# Patient Record
Sex: Male | Born: 1995 | Race: White | Hispanic: No | Marital: Married
Health system: Southern US, Community
[De-identification: ages and names within clinical notes are randomized; demographics above are authoritative.]

---
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
## 2014-08-30 ENCOUNTER — Ambulatory Visit (INDEPENDENT_AMBULATORY_CARE_PROVIDER_SITE_OTHER): Payer: BLUE CROSS/BLUE SHIELD | Admitting: Internal Medicine

## 2014-08-30 DIAGNOSIS — Z23 Encounter for immunization: Secondary | ICD-10-CM

## 2014-08-30 DIAGNOSIS — Z9189 Other specified personal risk factors, not elsewhere classified: Secondary | ICD-10-CM | POA: Diagnosis not present

## 2014-08-30 DIAGNOSIS — Z7184 Encounter for health counseling related to travel: Secondary | ICD-10-CM

## 2014-08-30 DIAGNOSIS — Z7189 Other specified counseling: Secondary | ICD-10-CM | POA: Diagnosis not present

## 2014-08-30 MED ORDER — CIPROFLOXACIN HCL 500 MG PO TABS: 500.0000 mg | ORAL_TABLET | Freq: Two times a day (BID) | ORAL | Status: AC

## 2014-08-30 NOTE — Progress Notes (Signed)
   Subjective:    Patient ID: Jeremy Gross, male    DOB: [birth year], 19 y.o.   MRN: [id number]  HPI Vernona Rieger19yo M in good state of health, college student at [employer] who has travel grant to go to [place], he will be leaving June 8th thru 20th visiting [place], [place], [place]. He will be staying with friends  Previous travel to [place]. Flu vaccine this year and uptodate on childhood vaccines including influenze   Review of Systems     Objective:   Physical Exam        Assessment & Plan:  Reviewed itinerary, it does not look like he is going to any endemic areas of malaria nor yellow fever  = gave typhoid vaccine = gave rx for cipro to use if needed = discussed how to acclimate for altitude sickness, and can use scheduled ibuprofen if needed

## 2015-07-11 DIAGNOSIS — N63 Unspecified lump in breast: Secondary | ICD-10-CM | POA: Diagnosis not present

## 2015-09-26 DIAGNOSIS — H1045 Other chronic allergic conjunctivitis: Secondary | ICD-10-CM | POA: Diagnosis not present

## 2015-10-31 DIAGNOSIS — L03039 Cellulitis of unspecified toe: Secondary | ICD-10-CM | POA: Diagnosis not present

## 2015-11-11 DIAGNOSIS — D2261 Melanocytic nevi of right upper limb, including shoulder: Secondary | ICD-10-CM | POA: Diagnosis not present

## 2015-11-11 DIAGNOSIS — L700 Acne vulgaris: Secondary | ICD-10-CM | POA: Diagnosis not present

## 2015-11-11 DIAGNOSIS — B078 Other viral warts: Secondary | ICD-10-CM | POA: Diagnosis not present

## 2015-11-11 DIAGNOSIS — D2271 Melanocytic nevi of right lower limb, including hip: Secondary | ICD-10-CM | POA: Diagnosis not present

## 2015-12-13 DIAGNOSIS — M546 Pain in thoracic spine: Secondary | ICD-10-CM | POA: Diagnosis not present

## 2015-12-13 DIAGNOSIS — X509XXA Other and unspecified overexertion or strenuous movements or postures, initial encounter: Secondary | ICD-10-CM | POA: Diagnosis not present

## 2016-01-03 DIAGNOSIS — Z23 Encounter for immunization: Secondary | ICD-10-CM | POA: Diagnosis not present

## 2016-02-24 DIAGNOSIS — F419 Anxiety disorder, unspecified: Secondary | ICD-10-CM | POA: Diagnosis not present

## 2016-02-24 DIAGNOSIS — F329 Major depressive disorder, single episode, unspecified: Secondary | ICD-10-CM | POA: Diagnosis not present

## 2016-05-01 DIAGNOSIS — L0232 Furuncle of buttock: Secondary | ICD-10-CM | POA: Diagnosis not present

## 2016-05-01 DIAGNOSIS — K5900 Constipation, unspecified: Secondary | ICD-10-CM | POA: Diagnosis not present

## 2016-05-05 DIAGNOSIS — R42 Dizziness and giddiness: Secondary | ICD-10-CM | POA: Diagnosis not present

## 2016-07-12 DIAGNOSIS — L0292 Furuncle, unspecified: Secondary | ICD-10-CM | POA: Diagnosis not present

## 2016-08-31 DIAGNOSIS — R1032 Left lower quadrant pain: Secondary | ICD-10-CM | POA: Diagnosis not present

## 2016-08-31 DIAGNOSIS — Z79899 Other long term (current) drug therapy: Secondary | ICD-10-CM | POA: Diagnosis not present

## 2016-08-31 DIAGNOSIS — K219 Gastro-esophageal reflux disease without esophagitis: Secondary | ICD-10-CM | POA: Diagnosis not present

## 2016-09-07 DIAGNOSIS — R05 Cough: Secondary | ICD-10-CM | POA: Diagnosis not present

## 2016-09-07 DIAGNOSIS — J3089 Other allergic rhinitis: Secondary | ICD-10-CM | POA: Diagnosis not present

## 2016-09-07 DIAGNOSIS — J301 Allergic rhinitis due to pollen: Secondary | ICD-10-CM | POA: Diagnosis not present

## 2016-09-07 DIAGNOSIS — J3081 Allergic rhinitis due to animal (cat) (dog) hair and dander: Secondary | ICD-10-CM | POA: Diagnosis not present

## 2016-12-23 DIAGNOSIS — F329 Major depressive disorder, single episode, unspecified: Secondary | ICD-10-CM | POA: Diagnosis not present

## 2017-01-25 DIAGNOSIS — Z23 Encounter for immunization: Secondary | ICD-10-CM | POA: Diagnosis not present

## 2017-01-27 DIAGNOSIS — F419 Anxiety disorder, unspecified: Secondary | ICD-10-CM | POA: Diagnosis not present

## 2017-01-28 DIAGNOSIS — H1045 Other chronic allergic conjunctivitis: Secondary | ICD-10-CM | POA: Diagnosis not present

## 2017-03-04 DIAGNOSIS — K219 Gastro-esophageal reflux disease without esophagitis: Secondary | ICD-10-CM | POA: Diagnosis not present

## 2017-03-04 DIAGNOSIS — R143 Flatulence: Secondary | ICD-10-CM | POA: Diagnosis not present

## 2017-03-21 DIAGNOSIS — D2239 Melanocytic nevi of other parts of face: Secondary | ICD-10-CM | POA: Diagnosis not present

## 2017-03-21 DIAGNOSIS — D2271 Melanocytic nevi of right lower limb, including hip: Secondary | ICD-10-CM | POA: Diagnosis not present

## 2017-03-21 DIAGNOSIS — L738 Other specified follicular disorders: Secondary | ICD-10-CM | POA: Diagnosis not present

## 2017-03-21 DIAGNOSIS — B078 Other viral warts: Secondary | ICD-10-CM | POA: Diagnosis not present

## 2017-03-21 DIAGNOSIS — D225 Melanocytic nevi of trunk: Secondary | ICD-10-CM | POA: Diagnosis not present

## 2017-04-08 DIAGNOSIS — F411 Generalized anxiety disorder: Secondary | ICD-10-CM | POA: Diagnosis not present

## 2017-04-08 DIAGNOSIS — F909 Attention-deficit hyperactivity disorder, unspecified type: Secondary | ICD-10-CM | POA: Diagnosis not present

## 2017-04-11 DIAGNOSIS — F909 Attention-deficit hyperactivity disorder, unspecified type: Secondary | ICD-10-CM | POA: Diagnosis not present

## 2017-04-11 DIAGNOSIS — F411 Generalized anxiety disorder: Secondary | ICD-10-CM | POA: Diagnosis not present

## 2017-04-20 DIAGNOSIS — F411 Generalized anxiety disorder: Secondary | ICD-10-CM | POA: Diagnosis not present

## 2017-04-20 DIAGNOSIS — F909 Attention-deficit hyperactivity disorder, unspecified type: Secondary | ICD-10-CM | POA: Diagnosis not present

## 2017-04-22 DIAGNOSIS — F900 Attention-deficit hyperactivity disorder, predominantly inattentive type: Secondary | ICD-10-CM | POA: Diagnosis not present

## 2017-04-22 DIAGNOSIS — F419 Anxiety disorder, unspecified: Secondary | ICD-10-CM | POA: Diagnosis not present

## 2017-04-26 DIAGNOSIS — F419 Anxiety disorder, unspecified: Secondary | ICD-10-CM | POA: Diagnosis not present

## 2017-04-29 DIAGNOSIS — F411 Generalized anxiety disorder: Secondary | ICD-10-CM | POA: Diagnosis not present

## 2017-04-29 DIAGNOSIS — F909 Attention-deficit hyperactivity disorder, unspecified type: Secondary | ICD-10-CM | POA: Diagnosis not present

## 2017-05-03 DIAGNOSIS — F909 Attention-deficit hyperactivity disorder, unspecified type: Secondary | ICD-10-CM | POA: Diagnosis not present

## 2017-05-03 DIAGNOSIS — F419 Anxiety disorder, unspecified: Secondary | ICD-10-CM | POA: Diagnosis not present

## 2017-05-11 DIAGNOSIS — F909 Attention-deficit hyperactivity disorder, unspecified type: Secondary | ICD-10-CM | POA: Diagnosis not present

## 2017-05-11 DIAGNOSIS — F419 Anxiety disorder, unspecified: Secondary | ICD-10-CM | POA: Diagnosis not present

## 2017-05-16 DIAGNOSIS — R1013 Epigastric pain: Secondary | ICD-10-CM | POA: Diagnosis not present

## 2017-05-16 DIAGNOSIS — R109 Unspecified abdominal pain: Secondary | ICD-10-CM | POA: Diagnosis not present

## 2017-05-23 DIAGNOSIS — F419 Anxiety disorder, unspecified: Secondary | ICD-10-CM | POA: Diagnosis not present

## 2017-05-23 DIAGNOSIS — F900 Attention-deficit hyperactivity disorder, predominantly inattentive type: Secondary | ICD-10-CM | POA: Diagnosis not present

## 2017-05-30 DIAGNOSIS — F900 Attention-deficit hyperactivity disorder, predominantly inattentive type: Secondary | ICD-10-CM | POA: Diagnosis not present

## 2017-05-30 DIAGNOSIS — R109 Unspecified abdominal pain: Secondary | ICD-10-CM | POA: Diagnosis not present

## 2017-05-30 DIAGNOSIS — F419 Anxiety disorder, unspecified: Secondary | ICD-10-CM | POA: Diagnosis not present

## 2017-06-15 DIAGNOSIS — F900 Attention-deficit hyperactivity disorder, predominantly inattentive type: Secondary | ICD-10-CM | POA: Diagnosis not present

## 2017-06-15 DIAGNOSIS — F419 Anxiety disorder, unspecified: Secondary | ICD-10-CM | POA: Diagnosis not present

## 2017-06-16 DIAGNOSIS — F419 Anxiety disorder, unspecified: Secondary | ICD-10-CM | POA: Diagnosis not present

## 2017-06-16 DIAGNOSIS — F900 Attention-deficit hyperactivity disorder, predominantly inattentive type: Secondary | ICD-10-CM | POA: Diagnosis not present

## 2017-06-23 DIAGNOSIS — F900 Attention-deficit hyperactivity disorder, predominantly inattentive type: Secondary | ICD-10-CM | POA: Diagnosis not present

## 2017-06-23 DIAGNOSIS — F419 Anxiety disorder, unspecified: Secondary | ICD-10-CM | POA: Diagnosis not present

## 2017-07-12 DIAGNOSIS — F419 Anxiety disorder, unspecified: Secondary | ICD-10-CM | POA: Diagnosis not present

## 2017-07-12 DIAGNOSIS — F900 Attention-deficit hyperactivity disorder, predominantly inattentive type: Secondary | ICD-10-CM | POA: Diagnosis not present

## 2017-07-27 DIAGNOSIS — F900 Attention-deficit hyperactivity disorder, predominantly inattentive type: Secondary | ICD-10-CM | POA: Diagnosis not present

## 2017-07-27 DIAGNOSIS — F419 Anxiety disorder, unspecified: Secondary | ICD-10-CM | POA: Diagnosis not present

## 2017-08-02 DIAGNOSIS — F909 Attention-deficit hyperactivity disorder, unspecified type: Secondary | ICD-10-CM | POA: Diagnosis not present

## 2017-08-02 DIAGNOSIS — F411 Generalized anxiety disorder: Secondary | ICD-10-CM | POA: Diagnosis not present

## 2017-10-17 DIAGNOSIS — F909 Attention-deficit hyperactivity disorder, unspecified type: Secondary | ICD-10-CM | POA: Diagnosis not present

## 2017-10-17 DIAGNOSIS — F419 Anxiety disorder, unspecified: Secondary | ICD-10-CM | POA: Diagnosis not present

## 2017-10-19 DIAGNOSIS — F419 Anxiety disorder, unspecified: Secondary | ICD-10-CM | POA: Diagnosis not present

## 2017-10-19 DIAGNOSIS — F900 Attention-deficit hyperactivity disorder, predominantly inattentive type: Secondary | ICD-10-CM | POA: Diagnosis not present

## 2017-12-22 DIAGNOSIS — Z23 Encounter for immunization: Secondary | ICD-10-CM | POA: Diagnosis not present

## 2018-02-03 DIAGNOSIS — F419 Anxiety disorder, unspecified: Secondary | ICD-10-CM | POA: Diagnosis not present

## 2018-02-03 DIAGNOSIS — F900 Attention-deficit hyperactivity disorder, predominantly inattentive type: Secondary | ICD-10-CM | POA: Diagnosis not present

## 2018-04-25 DIAGNOSIS — Z79899 Other long term (current) drug therapy: Secondary | ICD-10-CM | POA: Diagnosis not present

## 2018-04-25 DIAGNOSIS — F900 Attention-deficit hyperactivity disorder, predominantly inattentive type: Secondary | ICD-10-CM | POA: Diagnosis not present

## 2018-04-25 DIAGNOSIS — F411 Generalized anxiety disorder: Secondary | ICD-10-CM | POA: Diagnosis not present

## 2018-04-28 DIAGNOSIS — D225 Melanocytic nevi of trunk: Secondary | ICD-10-CM | POA: Diagnosis not present

## 2018-04-28 DIAGNOSIS — L812 Freckles: Secondary | ICD-10-CM | POA: Diagnosis not present

## 2018-04-28 DIAGNOSIS — D2222 Melanocytic nevi of left ear and external auricular canal: Secondary | ICD-10-CM | POA: Diagnosis not present

## 2018-04-28 DIAGNOSIS — L853 Xerosis cutis: Secondary | ICD-10-CM | POA: Diagnosis not present

## 2018-05-09 DIAGNOSIS — F411 Generalized anxiety disorder: Secondary | ICD-10-CM | POA: Diagnosis not present

## 2018-05-23 DIAGNOSIS — F411 Generalized anxiety disorder: Secondary | ICD-10-CM | POA: Diagnosis not present

## 2018-06-05 DIAGNOSIS — F411 Generalized anxiety disorder: Secondary | ICD-10-CM | POA: Diagnosis not present

## 2018-06-19 DIAGNOSIS — F411 Generalized anxiety disorder: Secondary | ICD-10-CM | POA: Diagnosis not present

## 2018-07-24 DIAGNOSIS — F411 Generalized anxiety disorder: Secondary | ICD-10-CM | POA: Diagnosis not present

## 2018-07-24 DIAGNOSIS — F4321 Adjustment disorder with depressed mood: Secondary | ICD-10-CM | POA: Diagnosis not present

## 2018-07-26 DIAGNOSIS — F900 Attention-deficit hyperactivity disorder, predominantly inattentive type: Secondary | ICD-10-CM | POA: Diagnosis not present

## 2018-07-26 DIAGNOSIS — F419 Anxiety disorder, unspecified: Secondary | ICD-10-CM | POA: Diagnosis not present

## 2018-08-07 DIAGNOSIS — F411 Generalized anxiety disorder: Secondary | ICD-10-CM | POA: Diagnosis not present

## 2018-08-29 DIAGNOSIS — M542 Cervicalgia: Secondary | ICD-10-CM | POA: Diagnosis not present

## 2018-09-27 DIAGNOSIS — F900 Attention-deficit hyperactivity disorder, predominantly inattentive type: Secondary | ICD-10-CM | POA: Diagnosis not present

## 2018-09-27 DIAGNOSIS — F411 Generalized anxiety disorder: Secondary | ICD-10-CM | POA: Diagnosis not present

## 2018-09-27 DIAGNOSIS — F4321 Adjustment disorder with depressed mood: Secondary | ICD-10-CM | POA: Diagnosis not present

## 2018-10-04 DIAGNOSIS — M791 Myalgia, unspecified site: Secondary | ICD-10-CM | POA: Diagnosis not present

## 2018-10-04 DIAGNOSIS — R109 Unspecified abdominal pain: Secondary | ICD-10-CM | POA: Diagnosis not present

## 2018-10-04 DIAGNOSIS — R0989 Other specified symptoms and signs involving the circulatory and respiratory systems: Secondary | ICD-10-CM | POA: Diagnosis not present

## 2018-10-04 DIAGNOSIS — R0602 Shortness of breath: Secondary | ICD-10-CM | POA: Diagnosis not present

## 2018-10-04 DIAGNOSIS — R5383 Other fatigue: Secondary | ICD-10-CM | POA: Diagnosis not present

## 2018-10-04 DIAGNOSIS — R197 Diarrhea, unspecified: Secondary | ICD-10-CM | POA: Diagnosis not present

## 2018-10-04 DIAGNOSIS — R112 Nausea with vomiting, unspecified: Secondary | ICD-10-CM | POA: Diagnosis not present

## 2018-10-04 DIAGNOSIS — Z20828 Contact with and (suspected) exposure to other viral communicable diseases: Secondary | ICD-10-CM | POA: Diagnosis not present

## 2018-10-04 DIAGNOSIS — R05 Cough: Secondary | ICD-10-CM | POA: Diagnosis not present

## 2018-10-10 DIAGNOSIS — F4321 Adjustment disorder with depressed mood: Secondary | ICD-10-CM | POA: Diagnosis not present

## 2018-10-10 DIAGNOSIS — L03213 Periorbital cellulitis: Secondary | ICD-10-CM | POA: Diagnosis not present

## 2018-10-10 DIAGNOSIS — F411 Generalized anxiety disorder: Secondary | ICD-10-CM | POA: Diagnosis not present

## 2018-10-16 DIAGNOSIS — F4321 Adjustment disorder with depressed mood: Secondary | ICD-10-CM | POA: Diagnosis not present

## 2018-10-16 DIAGNOSIS — F411 Generalized anxiety disorder: Secondary | ICD-10-CM | POA: Diagnosis not present

## 2018-10-24 DIAGNOSIS — F411 Generalized anxiety disorder: Secondary | ICD-10-CM | POA: Diagnosis not present

## 2018-10-24 DIAGNOSIS — F4321 Adjustment disorder with depressed mood: Secondary | ICD-10-CM | POA: Diagnosis not present

## 2018-11-07 DIAGNOSIS — F4321 Adjustment disorder with depressed mood: Secondary | ICD-10-CM | POA: Diagnosis not present

## 2018-11-07 DIAGNOSIS — F411 Generalized anxiety disorder: Secondary | ICD-10-CM | POA: Diagnosis not present

## 2018-11-07 DIAGNOSIS — F909 Attention-deficit hyperactivity disorder, unspecified type: Secondary | ICD-10-CM | POA: Diagnosis not present

## 2018-11-21 DIAGNOSIS — F411 Generalized anxiety disorder: Secondary | ICD-10-CM | POA: Diagnosis not present

## 2018-12-21 DIAGNOSIS — F4321 Adjustment disorder with depressed mood: Secondary | ICD-10-CM | POA: Diagnosis not present

## 2018-12-21 DIAGNOSIS — F411 Generalized anxiety disorder: Secondary | ICD-10-CM | POA: Diagnosis not present

## 2018-12-21 DIAGNOSIS — F900 Attention-deficit hyperactivity disorder, predominantly inattentive type: Secondary | ICD-10-CM | POA: Diagnosis not present

## 2018-12-28 DIAGNOSIS — Z23 Encounter for immunization: Secondary | ICD-10-CM | POA: Diagnosis not present

## 2019-01-18 DIAGNOSIS — F411 Generalized anxiety disorder: Secondary | ICD-10-CM | POA: Diagnosis not present

## 2019-01-31 DIAGNOSIS — F411 Generalized anxiety disorder: Secondary | ICD-10-CM | POA: Diagnosis not present

## 2019-01-31 DIAGNOSIS — F900 Attention-deficit hyperactivity disorder, predominantly inattentive type: Secondary | ICD-10-CM | POA: Diagnosis not present

## 2019-02-22 DIAGNOSIS — F900 Attention-deficit hyperactivity disorder, predominantly inattentive type: Secondary | ICD-10-CM | POA: Diagnosis not present

## 2019-02-22 DIAGNOSIS — F411 Generalized anxiety disorder: Secondary | ICD-10-CM | POA: Diagnosis not present

## 2019-02-22 DIAGNOSIS — F419 Anxiety disorder, unspecified: Secondary | ICD-10-CM | POA: Diagnosis not present

## 2019-02-22 DIAGNOSIS — F4321 Adjustment disorder with depressed mood: Secondary | ICD-10-CM | POA: Diagnosis not present

## 2019-02-28 DIAGNOSIS — N50811 Right testicular pain: Secondary | ICD-10-CM | POA: Diagnosis not present

## 2019-04-25 DIAGNOSIS — K219 Gastro-esophageal reflux disease without esophagitis: Secondary | ICD-10-CM | POA: Diagnosis not present

## 2019-07-02 ENCOUNTER — Ambulatory Visit: Payer: Self-pay | Attending: Internal Medicine

## 2019-07-02 DIAGNOSIS — Z23 Encounter for immunization: Secondary | ICD-10-CM

## 2019-07-02 NOTE — Progress Notes (Signed)
   Covid-19 Vaccination Clinic  Name:  BARRE AYDELOTT    MRN: [id number] DOB: [birth year]  07/02/2019  Mr. Heo was observed post Covid-19 immunization for 15 minutes without incident. He was provided with Vaccine Information Sheet and instruction to access the V-Safe system.   Mr. Hirano was instructed to call 911 with any severe reactions post vaccine: Marland Kitchen Difficulty breathing  . Swelling of face and throat  . A fast heartbeat  . A bad rash all over body  . Dizziness and weakness   Immunizations Administered    Name Date Dose VIS Date Route   Pfizer COVID-19 Vaccine 07/02/2019  2:15 PM 0.3 mL 03/16/2019 Intramuscular   Manufacturer: [employer], [employer]   Lot: [id number]   NDC: [address]-6

## 2019-08-16 DIAGNOSIS — F4321 Adjustment disorder with depressed mood: Secondary | ICD-10-CM | POA: Diagnosis not present

## 2019-08-16 DIAGNOSIS — F900 Attention-deficit hyperactivity disorder, predominantly inattentive type: Secondary | ICD-10-CM | POA: Diagnosis not present

## 2019-08-16 DIAGNOSIS — F411 Generalized anxiety disorder: Secondary | ICD-10-CM | POA: Diagnosis not present

## 2019-08-28 DIAGNOSIS — J0110 Acute frontal sinusitis, unspecified: Secondary | ICD-10-CM | POA: Diagnosis not present

## 2019-08-28 DIAGNOSIS — J309 Allergic rhinitis, unspecified: Secondary | ICD-10-CM | POA: Diagnosis not present

## 2019-08-28 DIAGNOSIS — H6982 Other specified disorders of Eustachian tube, left ear: Secondary | ICD-10-CM | POA: Diagnosis not present

## 2019-09-28 DIAGNOSIS — F900 Attention-deficit hyperactivity disorder, predominantly inattentive type: Secondary | ICD-10-CM | POA: Diagnosis not present

## 2019-09-28 DIAGNOSIS — F411 Generalized anxiety disorder: Secondary | ICD-10-CM | POA: Diagnosis not present

## 2019-09-28 DIAGNOSIS — F4321 Adjustment disorder with depressed mood: Secondary | ICD-10-CM | POA: Diagnosis not present

## 2019-11-06 DIAGNOSIS — Z1331 Encounter for screening for depression: Secondary | ICD-10-CM | POA: Diagnosis not present

## 2019-11-06 DIAGNOSIS — Z20822 Contact with and (suspected) exposure to covid-19: Secondary | ICD-10-CM | POA: Diagnosis not present

## 2019-11-26 DIAGNOSIS — Z20822 Contact with and (suspected) exposure to covid-19: Secondary | ICD-10-CM | POA: Diagnosis not present

## 2019-11-26 DIAGNOSIS — Z20828 Contact with and (suspected) exposure to other viral communicable diseases: Secondary | ICD-10-CM | POA: Diagnosis not present

## 2019-11-26 DIAGNOSIS — R0981 Nasal congestion: Secondary | ICD-10-CM | POA: Diagnosis not present

## 2019-12-04 DIAGNOSIS — G5762 Lesion of plantar nerve, left lower limb: Secondary | ICD-10-CM | POA: Diagnosis not present

## 2019-12-19 DIAGNOSIS — F900 Attention-deficit hyperactivity disorder, predominantly inattentive type: Secondary | ICD-10-CM | POA: Diagnosis not present

## 2019-12-19 DIAGNOSIS — F411 Generalized anxiety disorder: Secondary | ICD-10-CM | POA: Diagnosis not present

## 2019-12-19 DIAGNOSIS — F4321 Adjustment disorder with depressed mood: Secondary | ICD-10-CM | POA: Diagnosis not present

## 2019-12-27 DIAGNOSIS — F410 Panic disorder [episodic paroxysmal anxiety] without agoraphobia: Secondary | ICD-10-CM | POA: Diagnosis not present

## 2019-12-27 DIAGNOSIS — F4321 Adjustment disorder with depressed mood: Secondary | ICD-10-CM | POA: Diagnosis not present

## 2019-12-27 DIAGNOSIS — F411 Generalized anxiety disorder: Secondary | ICD-10-CM | POA: Diagnosis not present

## 2019-12-27 DIAGNOSIS — F900 Attention-deficit hyperactivity disorder, predominantly inattentive type: Secondary | ICD-10-CM | POA: Diagnosis not present

## 2020-01-03 DIAGNOSIS — F410 Panic disorder [episodic paroxysmal anxiety] without agoraphobia: Secondary | ICD-10-CM | POA: Diagnosis not present

## 2020-01-03 DIAGNOSIS — F900 Attention-deficit hyperactivity disorder, predominantly inattentive type: Secondary | ICD-10-CM | POA: Diagnosis not present

## 2020-01-03 DIAGNOSIS — F411 Generalized anxiety disorder: Secondary | ICD-10-CM | POA: Diagnosis not present

## 2020-01-03 DIAGNOSIS — F4321 Adjustment disorder with depressed mood: Secondary | ICD-10-CM | POA: Diagnosis not present

## 2020-01-10 DIAGNOSIS — F410 Panic disorder [episodic paroxysmal anxiety] without agoraphobia: Secondary | ICD-10-CM | POA: Diagnosis not present

## 2020-01-10 DIAGNOSIS — F4321 Adjustment disorder with depressed mood: Secondary | ICD-10-CM | POA: Diagnosis not present

## 2020-01-10 DIAGNOSIS — F900 Attention-deficit hyperactivity disorder, predominantly inattentive type: Secondary | ICD-10-CM | POA: Diagnosis not present

## 2020-01-10 DIAGNOSIS — F411 Generalized anxiety disorder: Secondary | ICD-10-CM | POA: Diagnosis not present

## 2020-01-17 DIAGNOSIS — F4322 Adjustment disorder with anxiety: Secondary | ICD-10-CM | POA: Diagnosis not present

## 2020-01-24 DIAGNOSIS — F900 Attention-deficit hyperactivity disorder, predominantly inattentive type: Secondary | ICD-10-CM | POA: Diagnosis not present

## 2020-01-24 DIAGNOSIS — F4321 Adjustment disorder with depressed mood: Secondary | ICD-10-CM | POA: Diagnosis not present

## 2020-01-24 DIAGNOSIS — F411 Generalized anxiety disorder: Secondary | ICD-10-CM | POA: Diagnosis not present

## 2020-01-24 DIAGNOSIS — F410 Panic disorder [episodic paroxysmal anxiety] without agoraphobia: Secondary | ICD-10-CM | POA: Diagnosis not present

## 2020-01-30 DIAGNOSIS — F410 Panic disorder [episodic paroxysmal anxiety] without agoraphobia: Secondary | ICD-10-CM | POA: Diagnosis not present

## 2020-01-30 DIAGNOSIS — F411 Generalized anxiety disorder: Secondary | ICD-10-CM | POA: Diagnosis not present

## 2020-01-30 DIAGNOSIS — F4321 Adjustment disorder with depressed mood: Secondary | ICD-10-CM | POA: Diagnosis not present

## 2020-01-30 DIAGNOSIS — F900 Attention-deficit hyperactivity disorder, predominantly inattentive type: Secondary | ICD-10-CM | POA: Diagnosis not present

## 2020-01-31 DIAGNOSIS — F4321 Adjustment disorder with depressed mood: Secondary | ICD-10-CM | POA: Diagnosis not present

## 2020-01-31 DIAGNOSIS — F410 Panic disorder [episodic paroxysmal anxiety] without agoraphobia: Secondary | ICD-10-CM | POA: Diagnosis not present

## 2020-01-31 DIAGNOSIS — F411 Generalized anxiety disorder: Secondary | ICD-10-CM | POA: Diagnosis not present

## 2020-01-31 DIAGNOSIS — F900 Attention-deficit hyperactivity disorder, predominantly inattentive type: Secondary | ICD-10-CM | POA: Diagnosis not present

## 2020-02-06 DIAGNOSIS — F411 Generalized anxiety disorder: Secondary | ICD-10-CM | POA: Diagnosis not present

## 2020-02-11 DIAGNOSIS — F411 Generalized anxiety disorder: Secondary | ICD-10-CM | POA: Diagnosis not present

## 2020-02-18 DIAGNOSIS — F411 Generalized anxiety disorder: Secondary | ICD-10-CM | POA: Diagnosis not present

## 2020-02-18 DIAGNOSIS — F900 Attention-deficit hyperactivity disorder, predominantly inattentive type: Secondary | ICD-10-CM | POA: Diagnosis not present

## 2020-02-18 DIAGNOSIS — F410 Panic disorder [episodic paroxysmal anxiety] without agoraphobia: Secondary | ICD-10-CM | POA: Diagnosis not present

## 2020-02-18 DIAGNOSIS — F4321 Adjustment disorder with depressed mood: Secondary | ICD-10-CM | POA: Diagnosis not present

## 2020-02-25 DIAGNOSIS — F900 Attention-deficit hyperactivity disorder, predominantly inattentive type: Secondary | ICD-10-CM | POA: Diagnosis not present

## 2020-02-25 DIAGNOSIS — F411 Generalized anxiety disorder: Secondary | ICD-10-CM | POA: Diagnosis not present

## 2020-02-25 DIAGNOSIS — F410 Panic disorder [episodic paroxysmal anxiety] without agoraphobia: Secondary | ICD-10-CM | POA: Diagnosis not present

## 2020-02-25 DIAGNOSIS — F4321 Adjustment disorder with depressed mood: Secondary | ICD-10-CM | POA: Diagnosis not present

## 2020-03-13 DIAGNOSIS — F411 Generalized anxiety disorder: Secondary | ICD-10-CM | POA: Diagnosis not present

## 2020-03-27 DIAGNOSIS — F411 Generalized anxiety disorder: Secondary | ICD-10-CM | POA: Diagnosis not present

## 2022-04-13 ENCOUNTER — Emergency Department (HOSPITAL_COMMUNITY)
Admission: EM | Admit: 2022-04-13 | Discharge: 2022-04-13 | Discharge status: Against Medical Advice | Payer: Federal, State, Local not specified - PPO | Attending: Emergency Medicine | Admitting: Emergency Medicine

## 2022-04-13 ENCOUNTER — Other Ambulatory Visit: Payer: Self-pay

## 2022-04-13 ENCOUNTER — Emergency Department (HOSPITAL_COMMUNITY): Payer: Federal, State, Local not specified - PPO

## 2022-04-13 DIAGNOSIS — Z5321 Procedure and treatment not carried out due to patient leaving prior to being seen by health care provider: Secondary | ICD-10-CM | POA: Insufficient documentation

## 2022-04-13 DIAGNOSIS — R3911 Hesitancy of micturition: Secondary | ICD-10-CM | POA: Diagnosis not present

## 2022-04-13 DIAGNOSIS — R200 Anesthesia of skin: Secondary | ICD-10-CM | POA: Diagnosis not present

## 2022-04-13 DIAGNOSIS — R202 Paresthesia of skin: Secondary | ICD-10-CM | POA: Diagnosis not present

## 2022-04-13 DIAGNOSIS — R3915 Urgency of urination: Secondary | ICD-10-CM | POA: Insufficient documentation

## 2022-04-13 LAB — URINALYSIS, ROUTINE W REFLEX MICROSCOPIC
Bilirubin Urine: NEGATIVE
Glucose, UA: NEGATIVE mg/dL
Hgb urine dipstick: NEGATIVE
Ketones, ur: NEGATIVE mg/dL
Leukocytes,Ua: NEGATIVE
Nitrite: NEGATIVE
Protein, ur: NEGATIVE mg/dL
Specific Gravity, Urine: 1.016 (ref 1.005–1.030)
pH: 6 (ref 5.0–8.0)

## 2022-04-13 NOTE — ED Triage Notes (Signed)
Patient reports right testicular pressure and numbness onset yesterday .

## 2022-04-13 NOTE — ED Notes (Signed)
Patient left w/o being seen, He stated he didn't want to stay any longer.

## 2022-04-13 NOTE — ED Provider Triage Note (Signed)
  Emergency Medicine Provider Triage Evaluation Note  MRN:  [id number]  Arrival date & time: 04/13/22    Medically screening exam initiated at 3:36 AM.   CC:   Testicular Pressure/Numbness   HPI:  Jeremy Gross is a 27 y.o. year-old male presents to the ED with chief complaint of numbness in groin.  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  Being followed by ortho.  States that over the past day he has had worsening paresthesias specifically in the scrotum.  He states that he has had urinary urgency and hesitancy.  He states that he has had increased burning sensation in bilateral lower extremities.  History provided by patient. ROS:  -As included in HPI PE:   Vitals:   04/13/22 0331  BP: (!) 153/80  Pulse: 88  Resp: 16  Temp: 98.7 F (37.1 C)  SpO2: 100%    Non-toxic appearing No respiratory distress Ambulatory MDM:  Based on signs and symptoms, worsening disc herniation is highest on my differential, followed by cauda equina. I've ordered MR lumbar in triage to expedite lab/diagnostic workup.  Patient was informed that the remainder of the evaluation will be completed by another provider, this initial triage assessment does not replace that evaluation, and the importance of remaining in the ED until their evaluation is complete.    Montine Circle, PA-C 04/13/22 [phone]
